# Patient Record
Sex: Male | Born: 1965 | Race: Black or African American | Hispanic: No | Marital: Single | Smoking: Never smoker
Health system: Southern US, Community
[De-identification: ages and names within clinical notes are randomized; demographics above are authoritative.]

---

## 2018-12-15 ENCOUNTER — Ambulatory Visit (HOSPITAL_COMMUNITY)
Admission: RE | Admit: 2018-12-15 | Discharge: 2018-12-15 | Disposition: A | Discharge status: Home / Self Care | Payer: No Typology Code available for payment source | Source: Ambulatory Visit | Attending: Chiropractic Medicine | Admitting: Chiropractic Medicine

## 2018-12-15 ENCOUNTER — Other Ambulatory Visit (HOSPITAL_COMMUNITY): Payer: Self-pay | Admitting: Chiropractic Medicine

## 2018-12-15 DIAGNOSIS — M4802 Spinal stenosis, cervical region: Secondary | ICD-10-CM | POA: Diagnosis not present

## 2018-12-15 DIAGNOSIS — M50322 Other cervical disc degeneration at C5-C6 level: Secondary | ICD-10-CM | POA: Diagnosis not present

## 2018-12-15 DIAGNOSIS — M4184 Other forms of scoliosis, thoracic region: Secondary | ICD-10-CM | POA: Insufficient documentation

## 2018-12-15 DIAGNOSIS — M25531 Pain in right wrist: Secondary | ICD-10-CM | POA: Diagnosis present

## 2018-12-15 DIAGNOSIS — Q7649 Other congenital malformations of spine, not associated with scoliosis: Secondary | ICD-10-CM | POA: Diagnosis not present

## 2018-12-15 DIAGNOSIS — M4602 Spinal enthesopathy, cervical region: Secondary | ICD-10-CM | POA: Diagnosis not present

## 2018-12-15 DIAGNOSIS — M546 Pain in thoracic spine: Secondary | ICD-10-CM | POA: Diagnosis not present

## 2019-02-21 ENCOUNTER — Other Ambulatory Visit (HOSPITAL_COMMUNITY): Payer: Self-pay | Admitting: Chiropractic Medicine

## 2019-02-21 DIAGNOSIS — M545 Low back pain, unspecified: Secondary | ICD-10-CM

## 2019-02-23 ENCOUNTER — Ambulatory Visit (HOSPITAL_COMMUNITY)
Admission: RE | Admit: 2019-02-23 | Discharge: 2019-02-23 | Disposition: A | Discharge status: Home / Self Care | Payer: Self-pay | Source: Ambulatory Visit | Attending: Chiropractic Medicine | Admitting: Chiropractic Medicine

## 2019-02-23 DIAGNOSIS — M545 Low back pain, unspecified: Secondary | ICD-10-CM

## 2019-03-14 ENCOUNTER — Ambulatory Visit (INDEPENDENT_AMBULATORY_CARE_PROVIDER_SITE_OTHER): Payer: Self-pay | Admitting: Family Medicine

## 2019-03-14 ENCOUNTER — Other Ambulatory Visit: Payer: Self-pay

## 2019-03-14 ENCOUNTER — Encounter (INDEPENDENT_AMBULATORY_CARE_PROVIDER_SITE_OTHER): Payer: Self-pay | Admitting: Family Medicine

## 2019-03-14 DIAGNOSIS — M545 Low back pain, unspecified: Secondary | ICD-10-CM

## 2019-03-14 DIAGNOSIS — M25562 Pain in left knee: Secondary | ICD-10-CM

## 2019-03-14 DIAGNOSIS — M542 Cervicalgia: Secondary | ICD-10-CM

## 2019-03-14 DIAGNOSIS — S8002XA Contusion of left knee, initial encounter: Secondary | ICD-10-CM

## 2019-03-14 MED ORDER — PREDNISONE 10 MG PO TABS: ORAL_TABLET | ORAL | 0 refills | Status: DC

## 2019-03-14 MED ORDER — ETODOLAC 400 MG PO TABS: ORAL_TABLET | ORAL | 3 refills | Status: DC

## 2019-03-14 MED ORDER — TIZANIDINE HCL 2 MG PO TABS: 2.0000 mg | ORAL_TABLET | Freq: Every evening | ORAL | 1 refills | Status: DC | PRN
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
## 2019-04-11 ENCOUNTER — Ambulatory Visit (INDEPENDENT_AMBULATORY_CARE_PROVIDER_SITE_OTHER): Payer: Self-pay | Admitting: Family Medicine

## 2019-04-12 ENCOUNTER — Ambulatory Visit (INDEPENDENT_AMBULATORY_CARE_PROVIDER_SITE_OTHER): Payer: Self-pay | Admitting: Family Medicine

## 2019-04-12 ENCOUNTER — Other Ambulatory Visit: Payer: Self-pay

## 2019-04-12 ENCOUNTER — Encounter (INDEPENDENT_AMBULATORY_CARE_PROVIDER_SITE_OTHER): Payer: Self-pay | Admitting: Family Medicine

## 2019-04-12 ENCOUNTER — Ambulatory Visit (INDEPENDENT_AMBULATORY_CARE_PROVIDER_SITE_OTHER): Payer: Self-pay

## 2019-04-12 DIAGNOSIS — M545 Low back pain, unspecified: Secondary | ICD-10-CM

## 2019-04-12 DIAGNOSIS — M25562 Pain in left knee: Secondary | ICD-10-CM

## 2019-04-12 DIAGNOSIS — M542 Cervicalgia: Secondary | ICD-10-CM
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
## 2019-06-05 ENCOUNTER — Ambulatory Visit
Admission: RE | Admit: 2019-06-05 | Discharge: 2019-06-05 | Disposition: A | Discharge status: Home / Self Care | Payer: Self-pay | Source: Ambulatory Visit | Attending: Family Medicine | Admitting: Family Medicine

## 2019-06-05 ENCOUNTER — Other Ambulatory Visit: Payer: Self-pay

## 2019-06-05 DIAGNOSIS — M542 Cervicalgia: Secondary | ICD-10-CM

## 2019-06-05 DIAGNOSIS — M25562 Pain in left knee: Secondary | ICD-10-CM

## 2019-06-06 ENCOUNTER — Telehealth: Payer: Self-pay | Admitting: Family Medicine

## 2019-06-06 DIAGNOSIS — M542 Cervicalgia: Secondary | ICD-10-CM

## 2019-06-06 NOTE — Telephone Encounter (Signed)
Spoke to patient about MRI results.  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  Should get better with time.  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  Also concerning is T2 hyperintense lesions in the spinal cord which could be myelomalacia, demyelinating dise, neoplam.  Will order MRI with contrast to evaluate, and also neurosurgery referral.
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
## 2019-07-09 ENCOUNTER — Other Ambulatory Visit: Payer: Self-pay

## 2019-07-24 ENCOUNTER — Encounter: Payer: Self-pay | Admitting: Family Medicine

## 2019-07-24 ENCOUNTER — Ambulatory Visit (INDEPENDENT_AMBULATORY_CARE_PROVIDER_SITE_OTHER): Payer: Self-pay | Admitting: Family Medicine

## 2019-07-24 DIAGNOSIS — M542 Cervicalgia: Secondary | ICD-10-CM

## 2019-07-24 DIAGNOSIS — M545 Low back pain, unspecified: Secondary | ICD-10-CM

## 2019-07-24 MED ORDER — GABAPENTIN 100 MG PO CAPS: ORAL_CAPSULE | ORAL | 3 refills | Status: DC
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
## 2019-08-11 ENCOUNTER — Ambulatory Visit
Admission: RE | Admit: 2019-08-11 | Discharge: 2019-08-11 | Disposition: A | Discharge status: Home / Self Care | Payer: Self-pay | Source: Ambulatory Visit | Attending: Family Medicine | Admitting: Family Medicine

## 2019-08-11 ENCOUNTER — Other Ambulatory Visit: Payer: Self-pay

## 2019-08-11 DIAGNOSIS — M542 Cervicalgia: Secondary | ICD-10-CM

## 2019-08-11 MED ORDER — GADOBENATE DIMEGLUMINE 529 MG/ML IV SOLN
20.0000 mL | Freq: Once | INTRAVENOUS | Status: AC | PRN
  Administered 2019-08-11: 11:00:00 20 mL via INTRAVENOUS

## 2019-08-13 ENCOUNTER — Telehealth: Payer: Self-pay | Admitting: *Deleted

## 2019-08-13 ENCOUNTER — Telehealth: Payer: Self-pay | Admitting: Family Medicine

## 2019-08-13 NOTE — Telephone Encounter (Signed)
-----   Message from Marlyne Beards, [place] sent at 08/13/2019  8:39 AM EDT ----- Regarding: RE: appointment with neuro Ok, thanks. ----- Message ----- From: Maureen Chatters, CMA Sent: 08/13/2019   8:37 AM EDT To: Marlyne Beards, CMA Subject: RE: appointment with neuro                     Per [facility] this is still in review.  ----- Message ----- From: Marlyne Beards, CMA Sent: 08/13/2019   8:28 AM EDT To: Maureen Chatters, CMA Subject: appointment with neuro                         Can you tell me if this patient has an appointment with the neurosurgion yet?

## 2019-08-13 NOTE — Telephone Encounter (Signed)
MRI with contrast shows the same findings as before.  Would still recommend monitoring by a neurosurgeon.

## 2019-08-13 NOTE — Telephone Encounter (Signed)
I checked with Gabriel Cirri - the referral is still under review at the neurosurgery office, so no appointment yet.

## 2019-08-16 ENCOUNTER — Telehealth: Payer: Self-pay | Admitting: Family Medicine

## 2019-08-16 NOTE — Telephone Encounter (Signed)
I spoke with the patient and advised him the MRI showed the same findings as on his previous MRI. The appointment with the neurosurgeon is still pending, but I will check with our referral coordinator about this as well.

## 2019-08-16 NOTE — Telephone Encounter (Signed)
Patient called needing the results of his MRI. The number to contact patient is [phone]

## 2019-08-17 NOTE — Telephone Encounter (Signed)
Do you mind checking again on this referral? I advised the patient that the neurosurgeon's office will be contacting him about the appointment.

## 2019-08-20 NOTE — Telephone Encounter (Signed)
Checked again today and Friday and this is still in review. They will contact pt to schedle

## 2019-08-29 ENCOUNTER — Telehealth: Payer: Self-pay | Admitting: Family Medicine

## 2019-08-29 NOTE — Telephone Encounter (Signed)
Patient called wanting to know if he can return to PT.  CB#[phone].  Thank you.

## 2019-08-29 NOTE — Telephone Encounter (Signed)
Please advise 

## 2019-08-29 NOTE — Telephone Encounter (Signed)
Yes, that's fine.  Does he need a referral?

## 2019-08-29 NOTE — Telephone Encounter (Signed)
I called and advised the patient. He said he does not need a new referral - he will return to the facility where he went before.

## 2019-09-25 ENCOUNTER — Telehealth: Payer: Self-pay | Admitting: Family Medicine

## 2019-09-25 DIAGNOSIS — M542 Cervicalgia: Secondary | ICD-10-CM

## 2019-09-25 NOTE — Telephone Encounter (Signed)
Patient called stating that Dr. Junius Roads had talked to him regarding some injections.  He was not sure where Dr. Junius Roads was going to refer him to, but he has not heard anything.  CB#[phone].  Thank you.

## 2019-09-27 NOTE — Telephone Encounter (Signed)
The patient would like to go the process of having the ESIs scheduled again. The original referral was closed because he was told by Abigail Butts we do not deal with 3rd party billing and he would be self pay. He did ask if we could go through his health insurance instead. I did inform him that he can discuss this with Dr. Romona Curls scheduler. A new referral would have to be placed.

## 2019-09-27 NOTE — Telephone Encounter (Signed)
Orders placed.

## 2019-10-24 ENCOUNTER — Ambulatory Visit: Payer: Self-pay | Admitting: Family Medicine

## 2019-10-24 ENCOUNTER — Encounter: Payer: Self-pay | Admitting: Physical Medicine and Rehabilitation

## 2019-10-24 ENCOUNTER — Telehealth: Payer: Self-pay | Admitting: *Deleted

## 2019-10-24 NOTE — Telephone Encounter (Signed)
Pt states that he will call to r/s appt with Dr. Junius Roads

## 2019-11-07 ENCOUNTER — Ambulatory Visit: Payer: Self-pay | Admitting: Family Medicine

## 2019-11-08 ENCOUNTER — Encounter: Payer: Self-pay | Admitting: Physical Medicine and Rehabilitation

## 2019-11-08 ENCOUNTER — Ambulatory Visit: Payer: Self-pay

## 2019-11-08 ENCOUNTER — Ambulatory Visit (INDEPENDENT_AMBULATORY_CARE_PROVIDER_SITE_OTHER): Payer: Self-pay | Admitting: Physical Medicine and Rehabilitation

## 2019-11-08 ENCOUNTER — Other Ambulatory Visit: Payer: Self-pay

## 2019-11-08 VITALS — BP 111/79 | HR 60 | Wt 200.0 lb

## 2019-11-08 DIAGNOSIS — M47812 Spondylosis without myelopathy or radiculopathy, cervical region: Secondary | ICD-10-CM

## 2019-11-08 MED ORDER — METHYLPREDNISOLONE ACETATE 80 MG/ML IJ SUSP
80.0000 mg | Freq: Once | INTRAMUSCULAR | Status: AC
  Administered 2019-11-08: 14:00:00 80 mg

## 2019-11-08 NOTE — Progress Notes (Signed)
 .  Numeric Pain Rating Scale and Functional Assessment Average Pain 8   In the last MONTH (on 0-10 scale) has pain interfered with the following?  1. General activity like being  able to carry out your everyday physical activities such as walking, climbing stairs, carrying groceries, or moving a chair?  Rating(6)   +Driver, -BT, -Dye Allergies.  

## 2019-11-16 ENCOUNTER — Other Ambulatory Visit: Payer: Self-pay

## 2019-11-16 ENCOUNTER — Encounter: Payer: Self-pay | Admitting: Family Medicine

## 2019-11-16 ENCOUNTER — Ambulatory Visit (INDEPENDENT_AMBULATORY_CARE_PROVIDER_SITE_OTHER): Payer: Self-pay | Admitting: Family Medicine

## 2019-11-16 DIAGNOSIS — M545 Low back pain, unspecified: Secondary | ICD-10-CM

## 2019-11-16 DIAGNOSIS — M25562 Pain in left knee: Secondary | ICD-10-CM

## 2019-11-16 DIAGNOSIS — M542 Cervicalgia: Secondary | ICD-10-CM
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
## 2019-12-10 ENCOUNTER — Other Ambulatory Visit: Payer: Self-pay | Admitting: Family Medicine

## 2019-12-10 MED ORDER — GABAPENTIN 100 MG PO CAPS: ORAL_CAPSULE | ORAL | 3 refills | Status: DC

## 2020-01-28 NOTE — Procedures (Signed)
Cervical Facet Joint Intra-Articular Injection with Fluoroscopic Guidance  Patient: Matthew Long      Date of Birth: Aug 07, 1966 MRN: [id number] PCP: Patient, No Pcp Per      Visit Date: 11/08/2019   Universal Protocol:    Date/Time: 02/01/216:07 AM  Consent Given By: the patient  Position: PRONE  Additional Comments: Vital signs were monitored before and after the procedure. Patient was prepped and draped in the usual sterile fashion. The correct patient, procedure, and site was verified.   Injection Procedure Details:  Procedure Site One Meds Administered:  Meds ordered this encounter  Medications  . methylPREDNISolone acetate (DEPO-MEDROL) injection 80 mg     Laterality: Bilateral  Location/Site:  C7-T1  Needle size: 25 G  Needle type: Spinal  Needle Placement: Articular  Findings:  -Contrast Used: 0.5 mL iohexol 180 mg iodine/mL   -Comments: Excellent flow of contrast producing a partial arthrogram.  Procedure Details: The region overlying the facet joints mentioned above were localized under fluoroscopic visualization. The needle was inserted down to the level of the lateral mass of the superior articular process of the facet joint to be injected. Then, the needle was "walked off" inferiorly into the lateral aspect of the facet joint. Bi-planar images were used for confirming placement and spot radiographs were documented.  A 0.25 ml volume of Omnipaque-240 was injected into the facet joint and a standard partial arthrogram was obtained. Radiographs were obtained of the arthrogram. A 0.5 ml. volume of the steroid/anesthetic solution was injected into the joint. This procedure was repeated for each facet joint injected.   Additional Comments:  The patient tolerated the procedure well Dressing: Band-Aid    Post-procedure details: Patient was observed during the procedure. Post-procedure instructions were reviewed.  Patient left the clinic in stable  condition.

## 2020-01-28 NOTE — Progress Notes (Signed)
Matthew Long - 54 y.o. male MRN [id number]  Date of birth: September 13, 1966  Office Visit Note: Visit Date: 11/08/2019 PCP: Patient, No Pcp Per Referred by: Lavada Mesi, MD  Subjective: Chief Complaint  Patient presents with  . Lower Back - Pain   HPI:  Matthew Long is a 54 y.o. male who comes in today For planned bilateral C7-T1 facet joint block.  Patient is followed by Dr. Casimiro Needle hilts.  MRI from June shows multilevel spondylosis as well as level of central stenosis.  There is also multiple levels of punctate areas of T2 hyperintense lesion in the spinal cord.  Patient is having mainly axial neck pain at the base of the cervical spine some referral pattern outward.  The patient has failed conservative care including home exercise, medications, time and activity modification.  This injection will be diagnostic and hopefully therapeutic.  Please see requesting physician notes for further details and justification.   ROS Otherwise per HPI.  Assessment & Plan: Visit Diagnoses:  1. Cervical spondylosis without myelopathy     Plan: No additional findings.   Meds & Orders:  Meds ordered this encounter  Medications  . methylPREDNISolone acetate (DEPO-MEDROL) injection 80 mg    Orders Placed This Encounter  Procedures  . Facet Injection  . XR C-ARM NO REPORT    Follow-up: No follow-ups on file.   Procedures: No procedures performed  Cervical Facet Joint Intra-Articular Injection with Fluoroscopic Guidance  Patient: Matthew Long      Date of Birth: [birth year] MRN: [id number] PCP: Patient, No Pcp Per      Visit Date: 11/08/2019   Universal Protocol:    Date/Time: 02/01/216:07 AM  Consent Given By: the patient  Position: PRONE  Additional Comments: Vital signs were monitored before and after the procedure. Patient was prepped and draped in the usual sterile fashion. The correct patient, procedure, and site was verified.   Injection Procedure Details:    Procedure Site One Meds Administered:  Meds ordered this encounter  Medications  . methylPREDNISolone acetate (DEPO-MEDROL) injection 80 mg     Laterality: Bilateral  Location/Site:  C7-T1  Needle size: 25 G  Needle type: Spinal  Needle Placement: Articular  Findings:  -Contrast Used: 0.5 mL iohexol 180 mg iodine/mL   -Comments: Excellent flow of contrast producing a partial arthrogram.  Procedure Details: The region overlying the facet joints mentioned above were localized under fluoroscopic visualization. The needle was inserted down to the level of the lateral mass of the superior articular process of the facet joint to be injected. Then, the needle was "walked off" inferiorly into the lateral aspect of the facet joint. Bi-planar images were used for confirming placement and spot radiographs were documented.  A 0.25 ml volume of Omnipaque-240 was injected into the facet joint and a standard partial arthrogram was obtained. Radiographs were obtained of the arthrogram. A 0.5 ml. volume of the steroid/anesthetic solution was injected into the joint. This procedure was repeated for each facet joint injected.   Additional Comments:  The patient tolerated the procedure well Dressing: Band-Aid    Post-procedure details: Patient was observed during the procedure. Post-procedure instructions were reviewed.  Patient left the clinic in stable condition.    Clinical History: MRI CERVICAL SPINE WITHOUT CONTRAST Vertebrae: Solid interbody fusion at C6-7. 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  Cord: Several T2 hyperintense lesions are present in the cervical cord.  Eccentric to the right at the C2 vertebral level there is a  1.0 by 0.2 by 0.2 cm T2 hyperintense cord lesion. Posteriorly along the midline cord at the C7-T1 level there is a 1.0 by 0.3 by 0.2 cm focus of accentuated T2 signal.  Questionable indistinct patchy cord signal hyperintensity at  the C3-C4-C5 levels suggested on STIR images but not on the T2 sagittal images. Motion artifact somewhat blurs this region especially on the T2 sagittal images.  Posterior Fossa, vertebral arteries, paraspinal tissues: Diminutive left vertebral artery. Otherwise unremarkable.  Disc levels:  C2-3: Borderline central narrowing of the thecal sac due to small central disc protrusion.  C3-4: Moderate to prominent central narrowing of the thecal sac due to left paracentral disc protrusion, intervertebral spurring, and short pedicles. There is associated moderate left and mild right foraminal stenosis.  C4-5: Prominent right and moderate to prominent left foraminal stenosis with moderate right eccentric central narrowing of the thecal sac due to right paracentral disc protrusion, intervertebral spurring, facet arthropathy, and short pedicles.  C5-6: Moderate to prominent bilateral foraminal stenosis and moderate central narrowing of the thecal sac due to disc bulge, intervertebral spurring, facet arthropathy, and congenitally short pedicles.  C6-7: Borderline central narrowing of the thecal sac from short pedicles and intervertebral spurring.  C7-T1: Prominent left and moderate right foraminal stenosis due to disc bulge, uncinate spurring, facet arthropathy, short pedicles.  IMPRESSION: 1. Cervical spondylosis, degenerative disc disease, and congenitally short pedicles cause prominent impingement at C4-5 and C7-T1; and moderate to prominent impingement at C3-4 and C5-6, as detailed above. 2. There are at least 2 and possibly more T2 hyperintense lesions in the cervical cord. One of these extends longitudinally eccentric to the far right at the C2 vertebral level in the other extends longitudinally in the posterior midline cord at the C7-T1 level. Vague inversion recovery sequence cord hyperintensities in the midcervical spine. There are a variety of possibilities  including demyelinating disease, myelomalacia, old trauma, or conceivably neoplastic causes. Correlate with patient history. Follow up MRI of the cervical spine with contrast may be warranted to assess whether these lesions are enhancing.   Electronically Signed By: Van Clines M.D. On: 06/05/2019 17:07 ------------- MRI LUMBAR SPINE WITHOUT CONTRAST   T12-L1: No significant disc bulge. No evidence of neural foraminal stenosis. No central canal stenosis.  L1-L2: No significant disc bulge. No evidence of neural foraminal stenosis. No central canal stenosis.  L2-L3: No significant disc bulge. No evidence of neural foraminal stenosis. No central canal stenosis. Mild bilateral facet arthropathy.  L3-L4: No significant disc bulge. No evidence of neural foraminal stenosis. No central canal stenosis. Mild bilateral facet arthropathy.  L4-L5: Mild broad-based disc bulge. Mild bilateral facet arthropathy. Bilateral lateral recess narrowing. No evidence of neural foraminal stenosis. No central canal stenosis.  L5-S1: Broad-based disc bulge. Moderate bilateral facet arthropathy. Moderate spinal stenosis. Bilateral lateral recess stenosis. No evidence of neural foraminal stenosis.  IMPRESSION: 1. At L4-5 there is a mild broad-based disc bulge. Mild bilateral facet arthropathy. Bilateral lateral recess narrowing. 2. At L5-S1 there is a broad-based disc bulge. Moderate bilateral facet arthropathy. Moderate spinal stenosis. Bilateral lateral recess stenosis.   Electronically Signed   By: Kathreen Devoid   On: 02/24/2019 12:41     Objective:  VS:  HT:    WT:200 lb (90.7 kg)  BMI:     BP:111/79  HR:60bpm  TEMP: ( )  RESP:  Physical Exam  Ortho Exam Imaging: No results found.

## 2020-02-15 ENCOUNTER — Encounter: Payer: Self-pay | Admitting: Family Medicine

## 2020-02-15 ENCOUNTER — Ambulatory Visit (INDEPENDENT_AMBULATORY_CARE_PROVIDER_SITE_OTHER): Payer: 59 | Admitting: Family Medicine

## 2020-02-15 ENCOUNTER — Other Ambulatory Visit: Payer: Self-pay

## 2020-02-15 DIAGNOSIS — M25562 Pain in left knee: Secondary | ICD-10-CM

## 2020-02-15 DIAGNOSIS — M545 Low back pain, unspecified: Secondary | ICD-10-CM

## 2020-02-15 DIAGNOSIS — M542 Cervicalgia: Secondary | ICD-10-CM | POA: Diagnosis not present
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
## 2020-02-28 ENCOUNTER — Ambulatory Visit: Payer: Self-pay

## 2020-02-28 ENCOUNTER — Encounter: Payer: Self-pay | Admitting: Physical Medicine and Rehabilitation

## 2020-02-28 ENCOUNTER — Other Ambulatory Visit: Payer: Self-pay

## 2020-02-28 ENCOUNTER — Ambulatory Visit (INDEPENDENT_AMBULATORY_CARE_PROVIDER_SITE_OTHER): Payer: 59 | Admitting: Physical Medicine and Rehabilitation

## 2020-02-28 DIAGNOSIS — M461 Sacroiliitis, not elsewhere classified: Secondary | ICD-10-CM | POA: Diagnosis not present

## 2020-02-28 NOTE — Progress Notes (Signed)
Matthew Long - 54 y.o. male MRN [id number]  Date of birth: 1966-09-17  Office Visit Note: Visit Date: 02/28/2020 PCP: Patient, No Pcp Per Referred by: Eunice Blase, MD  Subjective: Chief Complaint  Patient presents with  . Lower Back - Pain   HPI:  Matthew Long is a 54 y.o. male who comes in today For planned bilateral sacroiliac joint injections at the request of Dr. Legrand Como hilts.  Patient is having pain in the lower back and buttock regions and PSIS bilaterally.  He reports squatting down raising his legs makes the pain worse.  Is been ongoing now for several years with no relief despite conservative care.  As of note have seen the patient in the past for cervical facet joint blocks which have helped him greatly.  ROS Otherwise per HPI.  Assessment & Plan: Visit Diagnoses:  1. Sacroiliitis ([place])     Plan: No additional findings.   Meds & Orders: No orders of the defined types were placed in this encounter.   Orders Placed This Encounter  Procedures  . Sacroiliac Joint Inj  . XR C-ARM NO REPORT    Follow-up: Return for visit to requesting physician as needed.   Procedures: Sacroiliac Joint Inj on 02/28/2020 2:33 PM Indications: pain and diagnostic evaluation Details: 22 G 3.5 in needle, fluoroscopy-guided posterior approach Medications (Right): 40 mg methylPREDNISolone acetate 40 MG/ML; 2 mL bupivacaine 0.5 % Medications (Left): 40 mg methylPREDNISolone acetate 40 MG/ML; 2 mL bupivacaine 0.5 % Outcome: tolerated well, no immediate complications  There was excellent flow of contrast producing a partial arthrogram of the sacroiliac joint.  Procedure, treatment alternatives, risks and benefits explained, specific risks discussed. Consent was given by the patient. Immediately prior to procedure a time out was called to verify the correct patient, procedure, equipment, support staff and site/side marked as required. Patient was prepped and draped in the usual sterile  fashion.      No notes on file   Clinical History: MRI CERVICAL SPINE WITHOUT CONTRAST Vertebrae: Solid interbody fusion at C6-7. 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  Cord: Several T2 hyperintense lesions are present in the cervical cord.  Eccentric to the right at the C2 vertebral level there is a 1.0 by 0.2 by 0.2 cm T2 hyperintense cord lesion. Posteriorly along the midline cord at the C7-T1 level there is a 1.0 by 0.3 by 0.2 cm focus of accentuated T2 signal.  Questionable indistinct patchy cord signal hyperintensity at the C3-C4-C5 levels suggested on STIR images but not on the T2 sagittal images. Motion artifact somewhat blurs this region especially on the T2 sagittal images.  Posterior Fossa, vertebral arteries, paraspinal tissues: Diminutive left vertebral artery. Otherwise unremarkable.  Disc levels:  C2-3: Borderline central narrowing of the thecal sac due to small central disc protrusion.  C3-4: Moderate to prominent central narrowing of the thecal sac due to left paracentral disc protrusion, intervertebral spurring, and short pedicles. There is associated moderate left and mild right foraminal stenosis.  C4-5: Prominent right and moderate to prominent left foraminal stenosis with moderate right eccentric central narrowing of the thecal sac due to right paracentral disc protrusion, intervertebral spurring, facet arthropathy, and short pedicles.  C5-6: Moderate to prominent bilateral foraminal stenosis and moderate central narrowing of the thecal sac due to disc bulge, intervertebral spurring, facet arthropathy, and congenitally short pedicles.  C6-7: Borderline central narrowing of the thecal sac from short pedicles and intervertebral spurring.  C7-T1: Prominent left and moderate right foraminal  stenosis due to disc bulge, uncinate spurring, facet arthropathy, short pedicles.  IMPRESSION: 1. Cervical spondylosis,  degenerative disc disease, and congenitally short pedicles cause prominent impingement at C4-5 and C7-T1; and moderate to prominent impingement at C3-4 and C5-6, as detailed above. 2. There are at least 2 and possibly more T2 hyperintense lesions in the cervical cord. One of these extends longitudinally eccentric to the far right at the C2 vertebral level in the other extends longitudinally in the posterior midline cord at the C7-T1 level. Vague inversion recovery sequence cord hyperintensities in the midcervical spine. There are a variety of possibilities including demyelinating disease, myelomalacia, old trauma, or conceivably neoplastic causes. Correlate with patient history. Follow up MRI of the cervical spine with contrast may be warranted to assess whether these lesions are enhancing.   Electronically Signed By: Gaylyn Rong M.D. On: 06/05/2019 17:07 ------------- MRI LUMBAR SPINE WITHOUT CONTRAST   T12-L1: No significant disc bulge. No evidence of neural foraminal stenosis. No central canal stenosis.  L1-L2: No significant disc bulge. No evidence of neural foraminal stenosis. No central canal stenosis.  L2-L3: No significant disc bulge. No evidence of neural foraminal stenosis. No central canal stenosis. Mild bilateral facet arthropathy.  L3-L4: No significant disc bulge. No evidence of neural foraminal stenosis. No central canal stenosis. Mild bilateral facet arthropathy.  L4-L5: Mild broad-based disc bulge. Mild bilateral facet arthropathy. Bilateral lateral recess narrowing. No evidence of neural foraminal stenosis. No central canal stenosis.  L5-S1: Broad-based disc bulge. Moderate bilateral facet arthropathy. Moderate spinal stenosis. Bilateral lateral recess stenosis. No evidence of neural foraminal stenosis.  IMPRESSION: 1. At L4-5 there is a mild broad-based disc bulge. Mild bilateral facet arthropathy. Bilateral lateral recess  narrowing. 2. At L5-S1 there is a broad-based disc bulge. Moderate bilateral facet arthropathy. Moderate spinal stenosis. Bilateral lateral recess stenosis.   Electronically Signed   By: Elige Ko   On: 02/24/2019 12:41     Objective:  VS:  HT:    WT:   BMI:     BP:   HR: bpm  TEMP: ( )  RESP:  Physical Exam  Ortho Exam Imaging: No results found.

## 2020-02-28 NOTE — Progress Notes (Signed)
 .  Numeric Pain Rating Scale and Functional Assessment Average Pain 7   In the last MONTH (on 0-10 scale) has pain interfered with the following?  1. General activity like being  able to carry out your everyday physical activities such as walking, climbing stairs, carrying groceries, or moving a chair?  Rating(8)  -Dye Allergies.  

## 2020-03-04 MED ORDER — METHYLPREDNISOLONE ACETATE 40 MG/ML IJ SUSP
40.0000 mg | INTRAMUSCULAR | Status: AC | PRN
  Administered 2020-02-28: 40 mg via INTRA_ARTICULAR

## 2020-03-04 MED ORDER — BUPIVACAINE HCL 0.5 % IJ SOLN
2.0000 mL | INTRAMUSCULAR | Status: AC | PRN
  Administered 2020-02-28: 15:00:00 2 mL via INTRA_ARTICULAR

## 2020-03-04 MED ORDER — BUPIVACAINE HCL 0.5 % IJ SOLN
2.0000 mL | INTRAMUSCULAR | Status: AC | PRN
  Administered 2020-02-28: 2 mL via INTRA_ARTICULAR

## 2020-05-02 ENCOUNTER — Other Ambulatory Visit: Payer: Self-pay

## 2020-05-02 ENCOUNTER — Ambulatory Visit (INDEPENDENT_AMBULATORY_CARE_PROVIDER_SITE_OTHER): Payer: 59 | Admitting: Family Medicine

## 2020-05-02 ENCOUNTER — Encounter: Payer: Self-pay | Admitting: Family Medicine

## 2020-05-02 DIAGNOSIS — M545 Low back pain, unspecified: Secondary | ICD-10-CM

## 2020-05-02 DIAGNOSIS — M542 Cervicalgia: Secondary | ICD-10-CM | POA: Diagnosis not present
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
## 2020-05-20 ENCOUNTER — Other Ambulatory Visit: Payer: Self-pay | Admitting: Family Medicine

## 2020-05-21 ENCOUNTER — Other Ambulatory Visit: Payer: Self-pay | Admitting: Family Medicine

## 2020-05-21 DIAGNOSIS — M545 Low back pain, unspecified: Secondary | ICD-10-CM

## 2020-08-06 ENCOUNTER — Encounter: Payer: Self-pay | Admitting: Physical Medicine and Rehabilitation

## 2020-08-06 ENCOUNTER — Ambulatory Visit: Payer: Self-pay

## 2020-08-06 ENCOUNTER — Ambulatory Visit (INDEPENDENT_AMBULATORY_CARE_PROVIDER_SITE_OTHER): Payer: 59 | Admitting: Physical Medicine and Rehabilitation

## 2020-08-06 VITALS — BP 122/76 | HR 61

## 2020-08-06 DIAGNOSIS — M461 Sacroiliitis, not elsewhere classified: Secondary | ICD-10-CM | POA: Diagnosis not present

## 2020-08-06 MED ORDER — METHYLPREDNISOLONE ACETATE 80 MG/ML IJ SUSP
80.0000 mg | Freq: Once | INTRAMUSCULAR | Status: AC
  Administered 2020-08-06: 80 mg

## 2020-08-06 NOTE — Progress Notes (Signed)
Matthew Long - 54 y.o. male MRN [id number]  Date of birth: 1966/10/25  Office Visit Note: Visit Date: 08/06/2020 PCP: Patient, No Pcp Per Referred by: Lavada Mesi, MD  Subjective: Chief Complaint  Patient presents with  . Lower Back - Pain   HPI:  Matthew Long is a 54 y.o. male who comes in today for planned radiofrequency ablation of the Bilateral lateral branches of the sacroiliac joints . This would be ablation of the corresponding medial branches and/or dorsal rami.  Patient has had double diagnostic blocks with more than 50% relief.  These are documented in the chart..  They have had chronic back pain for quite some time, more than 3 months, which has been an ongoing situation with recalcitrant axial back pain.  They have no radicular pain.  And positive Fortin finger sign Patrick's exam..  They have had numerous chiropractic sessions with Dr. Hollice Espy as well as physical therapy as well as home exercise program. Accordingly they meet all the criteria and qualification for for radiofrequency ablation and we are going to complete this today hopefully for more longer term relief as part of comprehensive management program.  Referring Dr.: Lavada Mesi, MD  I have also seen the patient for cervical spine issues where he does have significant stenosis of the cervical spine.  Lumbar spine has not been nearly as bad and most of his pain is across the buttock and pelvis region.  Diagnostic injections have helped greatly.  ROS Otherwise per HPI.  Assessment & Plan: Visit Diagnoses:  1. Sacroiliitis (HCC)     Plan: No additional findings.   Meds & Orders:  Meds ordered this encounter  Medications  . methylPREDNISolone acetate (DEPO-MEDROL) injection 80 mg    Orders Placed This Encounter  Procedures  . Radiofrequency,Lumbar  . XR C-ARM NO REPORT    Follow-up: Return for visit to requesting physician as needed.   Procedures: No procedures performed  Sacroiliac Joint  Peripheral Nerve Denervation - Posterior Approach with Fluoroscopic Guidance   Patient: Matthew Long      Date of Birth: 1966/06/04 MRN: [id number] PCP: Patient, No Pcp Per      Visit Date: 08/06/2020   Universal Protocol:    Date/Time: 08/12/215:28 AM  Consent Given By: the patient  Position: PRONE  Additional Comments: Vital signs were monitored before and after the procedure. Patient was prepped and draped in the usual sterile fashion. The correct patient, procedure, and site was verified.   Injection Procedure Details:  Procedure Site One Meds Administered:  Meds ordered this encounter  Medications  . methylPREDNISolone acetate (DEPO-MEDROL) injection 80 mg     Laterality: Bilateral  Location/Site: Lateral branches L5, S1 and S2 L5-S1 S1-2 S2-3  Needle size: 18 G  Needle type: Cosman/[employer] radiofrequency cannula  Findings:    -Comments: Good biplanar imaging showing cannula localization with good motor stimulation.  Procedure Details: For the L5, S1 and S2 dorsal rami the fluoroscope was positioned to square off the sacrum or sacral ala to achieve a true AP midline view.  For the L5 dorsal rami the beam was then obliqued 15 to 20 degrees and caudally tilted 15 to 20 degrees to line up a trajectory along the target nerve. The skin over the target of the junction of superior articulating process and sacral ala was infiltrated with local anesthetic.  The 18 gauge 7mm active tip outer cannula was advanced in trajectory view to the target. The outer cannula placement was fine-tuned  and the position was then confirmed with bi-planar imaging.  For the S1 and S2 dorsal rami, no obliquity was utilized but the same caudal tilt was used.  Three targets corresponding to the 12 o'clock and 6 o'clock and the lateral mid-line positions around the foramen were  visualized. The 18 gauge 50mm active tip outer cannula was advanced in trajectory view to each target. The  outer cannula placement was fine-tuned and the position was then confirmed with bi-planar imaging  This procedure was repeated for each target nerve position.  Then, for all levels, the outer cannula placement was fine-tuned and the position was then confirmed with bi-planar imaging.    Test stimulation was done both at sensory and motor levels to ensure there was no radicular stimulation. The target tissues were then infiltrated with 1 ml of 1% Lidocaine without Epinephrine. Subsequently, a percutaneous neurotomy was carried out for90 seconds at 80 degrees Celsius.  One 60 second neurotomy was performed for each target around the S1 and S2 foramen. After the completion of the respective lesions, 1 ml of injectate was delivered.  Appropriate radiographs were obtained to verify the probe placement during the neurotomy.  Additional Comments:  The patient tolerated the procedure well Dressing: Band-Aid    Post-procedure details: Patient was observed during the procedure. Post-procedure instructions were reviewed. Follow-Up Instructions: Given as patient handout Patient left the clinic in stable condition.      Clinical History: MRI CERVICAL SPINE WITHOUT CONTRAST Vertebrae: Solid interbody fusion at C6-7. 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  Cord: Several T2 hyperintense lesions are present in the cervical cord.  Eccentric to the right at the C2 vertebral level there is a 1.0 by 0.2 by 0.2 cm T2 hyperintense cord lesion. Posteriorly along the midline cord at the C7-T1 level there is a 1.0 by 0.3 by 0.2 cm focus of accentuated T2 signal.  Questionable indistinct patchy cord signal hyperintensity at the C3-C4-C5 levels suggested on STIR images but not on the T2 sagittal images. Motion artifact somewhat blurs this region especially on the T2 sagittal images.  Posterior Fossa, vertebral arteries, paraspinal tissues: Diminutive left vertebral artery. Otherwise  unremarkable.  Disc levels:  C2-3: Borderline central narrowing of the thecal sac due to small central disc protrusion.  C3-4: Moderate to prominent central narrowing of the thecal sac due to left paracentral disc protrusion, intervertebral spurring, and short pedicles. There is associated moderate left and mild right foraminal stenosis.  C4-5: Prominent right and moderate to prominent left foraminal stenosis with moderate right eccentric central narrowing of the thecal sac due to right paracentral disc protrusion, intervertebral spurring, facet arthropathy, and short pedicles.  C5-6: Moderate to prominent bilateral foraminal stenosis and moderate central narrowing of the thecal sac due to disc bulge, intervertebral spurring, facet arthropathy, and congenitally short pedicles.  C6-7: Borderline central narrowing of the thecal sac from short pedicles and intervertebral spurring.  C7-T1: Prominent left and moderate right foraminal stenosis due to disc bulge, uncinate spurring, facet arthropathy, short pedicles.  IMPRESSION: 1. Cervical spondylosis, degenerative disc disease, and congenitally short pedicles cause prominent impingement at C4-5 and C7-T1; and moderate to prominent impingement at C3-4 and C5-6, as detailed above. 2. There are at least 2 and possibly more T2 hyperintense lesions in the cervical cord. One of these extends longitudinally eccentric to the far right at the C2 vertebral level in the other extends longitudinally in the posterior midline cord at the C7-T1 level. Vague inversion recovery sequence cord hyperintensities  in the midcervical spine. There are a variety of possibilities including demyelinating disease, myelomalacia, old trauma, or conceivably neoplastic causes. Correlate with patient history. Follow up MRI of the cervical spine with contrast may be warranted to assess whether these lesions are enhancing.   Electronically Signed By:  Gaylyn Rong M.D. On: 06/05/2019 17:07 ------------- MRI LUMBAR SPINE WITHOUT CONTRAST   T12-L1: No significant disc bulge. No evidence of neural foraminal stenosis. No central canal stenosis.  L1-L2: No significant disc bulge. No evidence of neural foraminal stenosis. No central canal stenosis.  L2-L3: No significant disc bulge. No evidence of neural foraminal stenosis. No central canal stenosis. Mild bilateral facet arthropathy.  L3-L4: No significant disc bulge. No evidence of neural foraminal stenosis. No central canal stenosis. Mild bilateral facet arthropathy.  L4-L5: Mild broad-based disc bulge. Mild bilateral facet arthropathy. Bilateral lateral recess narrowing. No evidence of neural foraminal stenosis. No central canal stenosis.  L5-S1: Broad-based disc bulge. Moderate bilateral facet arthropathy. Moderate spinal stenosis. Bilateral lateral recess stenosis. No evidence of neural foraminal stenosis.  IMPRESSION: 1. At L4-5 there is a mild broad-based disc bulge. Mild bilateral facet arthropathy. Bilateral lateral recess narrowing. 2. At L5-S1 there is a broad-based disc bulge. Moderate bilateral facet arthropathy. Moderate spinal stenosis. Bilateral lateral recess stenosis.   Electronically Signed   By: Elige Ko   On: 02/24/2019 12:41     Objective:  VS:  HT:    WT:   BMI:     BP:122/76  HR:61bpm  TEMP: ( )  RESP:  Physical Exam Constitutional:      General: He is not in acute distress.    Appearance: Normal appearance. He is not ill-appearing.  HENT:     Head: Normocephalic and atraumatic.     Right Ear: External ear normal.     Left Ear: External ear normal.  Eyes:     Extraocular Movements: Extraocular movements intact.  Cardiovascular:     Rate and Rhythm: Normal rate.     Pulses: Normal pulses.  Abdominal:     General: There is no distension.     Palpations: Abdomen is soft.  Musculoskeletal:        General: No  tenderness or signs of injury.     Right lower leg: No edema.     Left lower leg: No edema.     Comments: Patient has good distal strength without clonus.  Has a positive Patrick's sign bilaterally and positive Patrick's testing.  Skin:    Findings: No erythema or rash.  Neurological:     General: No focal deficit present.     Mental Status: He is alert and oriented to person, place, and time.     Sensory: No sensory deficit.     Motor: No weakness or abnormal muscle tone.     Coordination: Coordination normal.  Psychiatric:        Mood and Affect: Mood normal.        Behavior: Behavior normal.      Imaging: XR C-ARM NO REPORT  Result Date: 08/06/2020 Please see Notes tab for imaging impression.

## 2020-08-06 NOTE — Progress Notes (Signed)
Pt states left lower back pain. Pt states lifting his left leg makes the pain worse. Pt states laying downhelps with the pain.  Pt has hx of inj on 02/28/20 pt state it went well. Pt state the right inj lasted long than the left.  Numeric Pain Rating Scale and Functional Assessment Average Pain 7   In the last MONTH (on 0-10 scale) has pain interfered with the following?  1. General activity like being  able to carry out your everyday physical activities such as walking, climbing stairs, carrying groceries, or moving a chair?  Rating(8)   +Driver, -BT, -Dye Allergies.

## 2020-08-07 NOTE — Procedures (Signed)
Sacroiliac Joint Peripheral Nerve Denervation - Posterior Approach with Fluoroscopic Guidance   Patient: Matthew Long      Date of Birth: September 17, 1966 MRN: [id number] PCP: Patient, No Pcp Per      Visit Date: 08/06/2020   Universal Protocol:    Date/Time: 08/12/215:28 AM  Consent Given By: the patient  Position: PRONE  Additional Comments: Vital signs were monitored before and after the procedure. Patient was prepped and draped in the usual sterile fashion. The correct patient, procedure, and site was verified.   Injection Procedure Details:  Procedure Site One Meds Administered:  Meds ordered this encounter  Medications  . methylPREDNISolone acetate (DEPO-MEDROL) injection 80 mg     Laterality: Bilateral  Location/Site: Lateral branches L5, S1 and S2 L5-S1 S1-2 S2-3  Needle size: 18 G  Needle type: Cosman/[employer] radiofrequency cannula  Findings:    -Comments: Good biplanar imaging showing cannula localization with good motor stimulation.  Procedure Details: For the L5, S1 and S2 dorsal rami the fluoroscope was positioned to square off the sacrum or sacral ala to achieve a true AP midline view.  For the L5 dorsal rami the beam was then obliqued 15 to 20 degrees and caudally tilted 15 to 20 degrees to line up a trajectory along the target nerve. The skin over the target of the junction of superior articulating process and sacral ala was infiltrated with local anesthetic.  The 18 gauge 35mm active tip outer cannula was advanced in trajectory view to the target. The outer cannula placement was fine-tuned and the position was then confirmed with bi-planar imaging.  For the S1 and S2 dorsal rami, no obliquity was utilized but the same caudal tilt was used.  Three targets corresponding to the 12 o'clock and 6 o'clock and the lateral mid-line positions around the foramen were  visualized. The 18 gauge 59mm active tip outer cannula was advanced in trajectory view to  each target. The outer cannula placement was fine-tuned and the position was then confirmed with bi-planar imaging  This procedure was repeated for each target nerve position.  Then, for all levels, the outer cannula placement was fine-tuned and the position was then confirmed with bi-planar imaging.    Test stimulation was done both at sensory and motor levels to ensure there was no radicular stimulation. The target tissues were then infiltrated with 1 ml of 1% Lidocaine without Epinephrine. Subsequently, a percutaneous neurotomy was carried out for90 seconds at 80 degrees Celsius.  One 60 second neurotomy was performed for each target around the S1 and S2 foramen. After the completion of the respective lesions, 1 ml of injectate was delivered.  Appropriate radiographs were obtained to verify the probe placement during the neurotomy.  Additional Comments:  The patient tolerated the procedure well Dressing: Band-Aid    Post-procedure details: Patient was observed during the procedure. Post-procedure instructions were reviewed. Follow-Up Instructions: Given as patient handout Patient left the clinic in stable condition.

## 2020-11-03 ENCOUNTER — Telehealth: Payer: Self-pay | Admitting: Family Medicine

## 2020-11-03 NOTE — Telephone Encounter (Signed)
Records faxed to [facility] [phone], ph [phone], attn: Bronson Ing

## 2021-09-28 ENCOUNTER — Ambulatory Visit (INDEPENDENT_AMBULATORY_CARE_PROVIDER_SITE_OTHER): Payer: 59

## 2021-09-28 ENCOUNTER — Other Ambulatory Visit: Payer: Self-pay

## 2021-09-28 ENCOUNTER — Encounter: Payer: Self-pay | Admitting: Orthopedic Surgery

## 2021-09-28 ENCOUNTER — Ambulatory Visit (INDEPENDENT_AMBULATORY_CARE_PROVIDER_SITE_OTHER): Payer: Self-pay | Admitting: Orthopedic Surgery

## 2021-09-28 DIAGNOSIS — M545 Low back pain, unspecified: Secondary | ICD-10-CM | POA: Diagnosis not present
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
## 2021-10-21 ENCOUNTER — Telehealth: Payer: Self-pay | Admitting: Physical Medicine and Rehabilitation

## 2021-10-21 ENCOUNTER — Ambulatory Visit: Payer: 59 | Admitting: Physical Medicine and Rehabilitation

## 2021-10-21 ENCOUNTER — Telehealth: Payer: Self-pay | Admitting: Orthopedic Surgery

## 2021-10-21 NOTE — Telephone Encounter (Signed)
Patient called advised he is going to be 15 to 20 mins. Late. I called and advised upstairs and was told patient will need to reschedule. I advised patient of this.   Patient's number is [phone]

## 2021-10-21 NOTE — Telephone Encounter (Signed)
Received medical records release form from patient   forwarding to [facility]

## 2021-11-09 ENCOUNTER — Encounter: Payer: Self-pay | Admitting: Physical Medicine and Rehabilitation

## 2021-11-09 ENCOUNTER — Ambulatory Visit (INDEPENDENT_AMBULATORY_CARE_PROVIDER_SITE_OTHER): Payer: 59 | Admitting: Physical Medicine and Rehabilitation

## 2021-11-09 ENCOUNTER — Other Ambulatory Visit: Payer: Self-pay

## 2021-11-09 ENCOUNTER — Ambulatory Visit: Payer: Self-pay

## 2021-11-09 VITALS — BP 127/82 | HR 70

## 2021-11-09 DIAGNOSIS — M5416 Radiculopathy, lumbar region: Secondary | ICD-10-CM | POA: Diagnosis not present

## 2021-11-09 MED ORDER — BETAMETHASONE SOD PHOS & ACET 6 (3-3) MG/ML IJ SUSP
12.0000 mg | Freq: Once | INTRAMUSCULAR | Status: AC
  Administered 2021-11-09: 15:00:00 12 mg

## 2021-11-09 NOTE — Patient Instructions (Signed)

## 2021-11-09 NOTE — Progress Notes (Signed)
Pt states lower back pain that ravels to both legs. Pt states walking, standing and lifting both leg makes the pain worse. Pt states laying down helps with the pain. Pt state he doesn't take anything for the pain. Pt has hx of inj on 08/06/20 pt state it helped but not for a long time/  Numeric Pain Rating Scale and Functional Assessment Average Pain 8   In the last MONTH (on 0-10 scale) has pain interfered with the following?  1. General activity like being  able to carry out your everyday physical activities such as walking, climbing stairs, carrying groceries, or moving a chair?  Rating(10)   +Driver, -BT, -Dye Allergies.

## 2021-11-23 NOTE — Procedures (Signed)
Lumbar Epidural Steroid Injection - Interlaminar Approach with Fluoroscopic Guidance  Patient: Matthew Long      Date of Birth: 1966/04/13 MRN: [id number] PCP: Patient, No Pcp Per (Inactive)      Visit Date: 11/09/2021   Universal Protocol:     Consent Given By: the patient  Position: PRONE  Additional Comments: Vital signs were monitored before and after the procedure. Patient was prepped and draped in the usual sterile fashion. The correct patient, procedure, and site was verified.   Injection Procedure Details:   Procedure diagnoses: Lumbar radiculopathy [M54.16]   Meds Administered:  Meds ordered this encounter  Medications   betamethasone acetate-betamethasone sodium phosphate (CELESTONE) injection 12 mg     Laterality: Right  Location/Site:  L5-S1  Needle: 3.5 in., 20 ga. Tuohy  Needle Placement: Paramedian epidural  Findings:   -Comments: Excellent flow of contrast into the epidural space.  Procedure Details: Using a paramedian approach from the side mentioned above, the region overlying the inferior lamina was localized under fluoroscopic visualization and the soft tissues overlying this structure were infiltrated with 4 ml. of 1% Lidocaine without Epinephrine. The Tuohy needle was inserted into the epidural space using a paramedian approach.   The epidural space was localized using loss of resistance along with counter oblique bi-planar fluoroscopic views.  After negative aspirate for air, blood, and CSF, a 2 ml. volume of Isovue-250 was injected into the epidural space and the flow of contrast was observed. Radiographs were obtained for documentation purposes.    The injectate was administered into the level noted above.   Additional Comments:  No complications occurred Dressing: 2 x 2 sterile gauze and Band-Aid    Post-procedure details: Patient was observed during the procedure. Post-procedure instructions were reviewed.  Patient left the clinic  in stable condition.

## 2021-11-23 NOTE — Progress Notes (Signed)
Matthew Long - 55 y.o. male MRN [id number]  Date of birth: December 03, 1966  Office Visit Note: Visit Date: 11/09/2021 PCP: Patient, No Pcp Per (Inactive) Referred by: Cammy Copa, MD  Subjective: Chief Complaint  Patient presents with   Lower Back - Pain   Left Leg - Pain   Right Leg - Pain   HPI:  Matthew Long is a 55 y.o. male who comes in today at the request of Dr. Burnard Bunting for planned Right L5-S1 Lumbar Interlaminar epidural steroid injection with fluoroscopic guidance.  The patient has failed conservative care including home exercise, medications, time and activity modification.  This injection will be diagnostic and hopefully therapeutic.  Please see requesting physician notes for further details and justification. MRI reviewed with images and spine model.  MRI reviewed in the note below.  Patient had 2 prior sacroiliac joint injections with temporary relief.  MRI had initially been obtained by his chiropractor Dr. Maury Dus.  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  Depending on relief patient may do well with lumbar decompression.  ROS Otherwise per HPI.  Assessment & Plan: Visit Diagnoses:    ICD-10-CM   1. Lumbar radiculopathy  M54.16 XR C-ARM NO REPORT    Epidural Steroid injection    betamethasone acetate-betamethasone sodium phosphate (CELESTONE) injection 12 mg      Plan: No additional findings.   Meds & Orders:  Meds ordered this encounter  Medications   betamethasone acetate-betamethasone sodium phosphate (CELESTONE) injection 12 mg    Orders Placed This Encounter  Procedures   XR C-ARM NO REPORT   Epidural Steroid injection    Follow-up: Return for visit to requesting provider as needed.   Procedures: No procedures performed  Lumbar Epidural Steroid Injection - Interlaminar Approach with Fluoroscopic Guidance  Patient: Matthew Long      Date of Birth: [birth year] MRN:  [id number] PCP: Patient, No Pcp Per (Inactive)      Visit Date: 11/09/2021   Universal Protocol:     Consent Given By: the patient  Position: PRONE  Additional Comments: Vital signs were monitored before and after the procedure. Patient was prepped and draped in the usual sterile fashion. The correct patient, procedure, and site was verified.   Injection Procedure Details:   Procedure diagnoses: Lumbar radiculopathy [M54.16]   Meds Administered:  Meds ordered this encounter  Medications   betamethasone acetate-betamethasone sodium phosphate (CELESTONE) injection 12 mg     Laterality: Right  Location/Site:  L5-S1  Needle: 3.5 in., 20 ga. Tuohy  Needle Placement: Paramedian epidural  Findings:   -Comments: Excellent flow of contrast into the epidural space.  Procedure Details: Using a paramedian approach from the side mentioned above, the region overlying the inferior lamina was localized under fluoroscopic visualization and the soft tissues overlying this structure were infiltrated with 4 ml. of 1% Lidocaine without Epinephrine. The Tuohy needle was inserted into the epidural space using a paramedian approach.   The epidural space was localized using loss of resistance along with counter oblique bi-planar fluoroscopic views.  After negative aspirate for air, blood, and CSF, a 2 ml. volume of Isovue-250 was injected into the epidural space and the flow of contrast was observed. Radiographs were obtained for documentation purposes.    The injectate was administered into the level noted above.   Additional Comments:  No complications occurred Dressing: 2 x 2 sterile gauze and Band-Aid  Post-procedure details: Patient was observed during the procedure. Post-procedure instructions were reviewed.  Patient left the clinic in stable condition.   Clinical History: MRI LUMBAR SPINE WITHOUT CONTRAST   TECHNIQUE: Multiplanar, multisequence MR imaging of the lumbar  spine was performed. No intravenous contrast was administered.   COMPARISON:  None.   FINDINGS: Segmentation:  Standard.   Alignment:  Physiologic.   Vertebrae:  No fracture, evidence of discitis, or bone lesion.   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   Conus medullaris and cauda equina: Conus extends to the T11-12 level. Conus and cauda equina appear normal.   Paraspinal and other soft tissues: No acute paraspinal abnormality.   Disc levels:   Disc spaces: Disc spaces are maintained.   T12-L1: No significant disc bulge. No evidence of neural foraminal stenosis. No central canal stenosis.   L1-L2: No significant disc bulge. No evidence of neural foraminal stenosis. No central canal stenosis.   L2-L3: No significant disc bulge. No evidence of neural foraminal stenosis. No central canal stenosis. Mild bilateral facet arthropathy.   L3-L4: No significant disc bulge. No evidence of neural foraminal stenosis. No central canal stenosis. Mild bilateral facet arthropathy.   L4-L5: Mild broad-based disc bulge. Mild bilateral facet arthropathy. Bilateral lateral recess narrowing. No evidence of neural foraminal stenosis. No central canal stenosis.   L5-S1: Broad-based disc bulge. Moderate bilateral facet arthropathy. Moderate spinal stenosis. Bilateral lateral recess stenosis. No evidence of neural foraminal stenosis.   IMPRESSION: 1. At L4-5 there is a mild broad-based disc bulge. Mild bilateral facet arthropathy. Bilateral lateral recess narrowing. 2. At L5-S1 there is a broad-based disc bulge. Moderate bilateral facet arthropathy. Moderate spinal stenosis. Bilateral lateral recess stenosis.     Electronically Signed   By: Elige Ko   On: 02/24/2019 12:41     Objective:  VS:  HT:    WT:   BMI:     BP:127/82  HR:70bpm  TEMP: ( )  RESP:  Physical Exam Vitals and nursing note reviewed.  Constitutional:      General: He is not in acute  distress.    Appearance: Normal appearance. He is not ill-appearing.  HENT:     Head: Normocephalic and atraumatic.     Right Ear: External ear normal.     Left Ear: External ear normal.     Nose: No congestion.  Eyes:     Extraocular Movements: Extraocular movements intact.  Cardiovascular:     Rate and Rhythm: Normal rate.     Pulses: Normal pulses.  Pulmonary:     Effort: Pulmonary effort is normal. No respiratory distress.  Abdominal:     General: There is no distension.     Palpations: Abdomen is soft.  Musculoskeletal:        General: No tenderness or signs of injury.     Cervical back: Neck supple.     Right lower leg: No edema.     Left lower leg: No edema.     Comments: Patient has good distal strength without clonus.  Skin:    Findings: No erythema or rash.  Neurological:     General: No focal deficit present.     Mental Status: He is alert and oriented to person, place, and time.     Sensory: No sensory deficit.     Motor: No weakness or abnormal muscle tone.     Coordination: Coordination normal.  Psychiatric:        Mood and Affect: Mood normal.  Behavior: Behavior normal.     Imaging: No results found.
